# Patient Record
Sex: Male | Born: 1950 | Race: White | Hispanic: No | Marital: Married | Smoking: Former smoker
Health system: Southern US, Community
[De-identification: ages and names within clinical notes are randomized; demographics above are authoritative.]

## PROBLEM LIST (undated history)

## (undated) DIAGNOSIS — I10 Essential (primary) hypertension: Secondary | ICD-10-CM

## (undated) DIAGNOSIS — M1990 Unspecified osteoarthritis, unspecified site: Secondary | ICD-10-CM

## (undated) HISTORY — PX: TONSILLECTOMY: SUR1361

## (undated) HISTORY — PX: ARM AMPUTATION AT ELBOW: SHX550

---

## 2001-10-04 ENCOUNTER — Emergency Department (HOSPITAL_COMMUNITY): Admission: EM | Admit: 2001-10-04 | Discharge: 2001-10-05 | Payer: Self-pay

## 2001-10-05 ENCOUNTER — Encounter: Payer: Self-pay | Admitting: Emergency Medicine

## 2004-06-16 ENCOUNTER — Ambulatory Visit: Payer: Self-pay | Admitting: Internal Medicine

## 2007-11-15 ENCOUNTER — Ambulatory Visit: Payer: Self-pay | Admitting: Internal Medicine

## 2007-11-15 DIAGNOSIS — M79609 Pain in unspecified limb: Secondary | ICD-10-CM

## 2007-11-15 DIAGNOSIS — L408 Other psoriasis: Secondary | ICD-10-CM

## 2007-11-15 DIAGNOSIS — G4730 Sleep apnea, unspecified: Secondary | ICD-10-CM | POA: Insufficient documentation

## 2007-11-15 DIAGNOSIS — I10 Essential (primary) hypertension: Secondary | ICD-10-CM | POA: Insufficient documentation

## 2007-11-15 DIAGNOSIS — E785 Hyperlipidemia, unspecified: Secondary | ICD-10-CM | POA: Insufficient documentation

## 2007-11-15 LAB — CONVERTED CEMR LAB
Cholesterol, target level: 200 mg/dL
HDL goal, serum: 40 mg/dL
LDL Goal: 90 mg/dL

## 2007-11-16 ENCOUNTER — Encounter (INDEPENDENT_AMBULATORY_CARE_PROVIDER_SITE_OTHER): Payer: Self-pay | Admitting: *Deleted

## 2007-12-08 ENCOUNTER — Encounter: Payer: Self-pay | Admitting: Internal Medicine

## 2009-11-20 ENCOUNTER — Ambulatory Visit: Payer: Self-pay | Admitting: Family Medicine

## 2009-11-20 DIAGNOSIS — M25579 Pain in unspecified ankle and joints of unspecified foot: Secondary | ICD-10-CM

## 2010-08-04 NOTE — Assessment & Plan Note (Signed)
Summary: RIGH ANKLE PAIN   Vital Signs:  Patient Profile:   60 Years Old Male CC:      right ankle pain X yesterday Height:     76 inches Weight:      256 pounds O2 Sat:      98 % O2 treatment:    Room Air Temp:     97.5 degrees F oral Pulse rate:   79 / minute Pulse rhythm:   regular Resp:     14 per minute BP sitting:   167 / 95  (left arm) Cuff size:   large  Pt. in pain?   yes    Location:   right ankle    Intensity:   5    Type:       sharp  Vitals Entered By: Lajean Saver RN (Nov 20, 2009 3:14 PM)                   Updated Prior Medication List: ASPIRIN 81 MG TBEC (ASPIRIN) once daily PREDNISONE 10 MG  TABS (PREDNISONE) 5 TABS DAY 1, 4 TABS DAY 2, 3 TABS DAY 3, 2 TABS DAY 4, 1 TAB DAY 5 WITH FOOD  Current Allergies: No known allergies History of Present Illness Chief Complaint: right ankle pain X yesterday History of Present Illness: RIGHT ANKLE PAIN AND SWELLING. ONSET YESTERDAY. DENIES INJURY OR PRIOR EPISODE. NO HX OF GOUT. STATES HARD TO WALK ON.   REVIEW OF SYSTEMS Constitutional Symptoms      Denies fever, chills, night sweats, weight loss, weight gain, and fatigue.  Eyes       Denies change in vision, eye pain, eye discharge, glasses, contact lenses, and eye surgery. Ear/Nose/Throat/Mouth       Denies hearing loss/aids, change in hearing, ear pain, ear discharge, dizziness, frequent runny nose, frequent nose bleeds, sinus problems, sore throat, hoarseness, and tooth pain or bleeding.  Respiratory       Denies dry cough, productive cough, wheezing, shortness of breath, asthma, bronchitis, and emphysema/COPD.  Cardiovascular       Denies murmurs, chest pain, and tires easily with exhertion.    Gastrointestinal       Denies stomach pain, nausea/vomiting, diarrhea, constipation, blood in bowel movements, and indigestion. Genitourniary       Denies painful urination, kidney stones, and loss of urinary control. Neurological       Denies paralysis,  seizures, and fainting/blackouts. Musculoskeletal       Complains of joint pain, joint stiffness, and swelling.      Denies muscle pain, decreased range of motion, redness, muscle weakness, and gout.      Comments: right ankle Skin       Denies bruising, unusual mles/lumps or sores, and hair/skin or nail changes.  Psych       Denies mood changes, temper/anger issues, anxiety/stress, speech problems, depression, and sleep problems. Other Comments: patient states he woke up feeling stiff, as the day went on and with movement the pain increased gradually to a 10/10   Past History:  Past Medical History: Hypertension Current Problems:  GILBERT'S SYNDROME (ICD-277.4) PSORIASIS (ICD-696.1) SLEEP APNEA (ICD-780.57) HYPERTENSION (ICD-401.9)     Past Surgical History: Tonsillectomy Sx to right arm  Family History: Reviewed history from 11/15/2007 and no changes required. Family History Diabetes 1st degree relative-FATHER Family History Hypertension: FATHER, BROTHER, FATHER'S SIDE OF THE FAMILY MI-FATHER, BROTHER, P-UNCLE CVA-FATHER  Social History: Occupation: Holiday representative Married Quit smoking 30 years ago Alcohol use-yes,  24/week Drug use-no  Drug Use:  no Physical Exam General appearance: well developed, well nourished, no acute distress Extremities: PAIN AND SWELLING RIGHT ANKLE. NO INCREASED WARMPTH. NO CALF TENDERNESS.Marland Kitchen PAIN WITH FLEXION AND EXTENTION. LESS PAIN WITH INVERSION AND EVERSION.  Skin: no obvious rashes or lesions BP SLIGHTLY ELEVATED. RECOMMEND HE MONITOR AND SEEK EVAL IF IT REMAINS ELIVATED Assessment New Problems: ANKLE PAIN, RIGHT (ICD-719.47)   Plan New Medications/Changes: PREDNISONE 10 MG  TABS (PREDNISONE) 5 TABS DAY 1, 4 TABS DAY 2, 3 TABS DAY 3, 2 TABS DAY 4, 1 TAB DAY 5 WITH FOOD  ##15 x 0, 11/20/2009, Marvis Moeller DO  New Orders: Ace Wraps 3-5 in/yard  [Z6109] New Patient Level III [99203]   Prescriptions: PREDNISONE 10 MG  TABS  (PREDNISONE) 5 TABS DAY 1, 4 TABS DAY 2, 3 TABS DAY 3, 2 TABS DAY 4, 1 TAB DAY 5 WITH FOOD  ##15 x 0   Entered and Authorized by:   Marvis Moeller DO   Signed by:   Marvis Moeller DO on 11/20/2009   Method used:   Print then Give to Patient   RxID:   [id number]   Patient Instructions: 1)  WEAR ACE FOR 5-6 DAYS. ELEVATE ABOVE WAIST. APPLY HEAT THREE TIMES DAILY FOR 15 MIN. MONITOR YOUR BP. FOLLOW UP WITH YOUR PCP OR RETURN IF SYMPTOMSPERSIST.

## 2016-12-23 ENCOUNTER — Emergency Department (HOSPITAL_COMMUNITY): Payer: BLUE CROSS/BLUE SHIELD

## 2016-12-23 ENCOUNTER — Encounter (HOSPITAL_COMMUNITY): Payer: Self-pay | Admitting: *Deleted

## 2016-12-23 ENCOUNTER — Emergency Department (HOSPITAL_COMMUNITY)
Admission: EM | Admit: 2016-12-23 | Discharge: 2016-12-23 | Disposition: A | Discharge status: Home / Self Care | Payer: BLUE CROSS/BLUE SHIELD | Attending: Emergency Medicine | Admitting: Emergency Medicine

## 2016-12-23 DIAGNOSIS — Y939 Activity, unspecified: Secondary | ICD-10-CM | POA: Insufficient documentation

## 2016-12-23 DIAGNOSIS — R55 Syncope and collapse: Secondary | ICD-10-CM | POA: Insufficient documentation

## 2016-12-23 DIAGNOSIS — I10 Essential (primary) hypertension: Secondary | ICD-10-CM | POA: Diagnosis not present

## 2016-12-23 DIAGNOSIS — S0181XA Laceration without foreign body of other part of head, initial encounter: Secondary | ICD-10-CM | POA: Insufficient documentation

## 2016-12-23 DIAGNOSIS — Z87891 Personal history of nicotine dependence: Secondary | ICD-10-CM | POA: Insufficient documentation

## 2016-12-23 DIAGNOSIS — Y929 Unspecified place or not applicable: Secondary | ICD-10-CM | POA: Diagnosis not present

## 2016-12-23 DIAGNOSIS — Y999 Unspecified external cause status: Secondary | ICD-10-CM | POA: Diagnosis not present

## 2016-12-23 DIAGNOSIS — W19XXXA Unspecified fall, initial encounter: Secondary | ICD-10-CM | POA: Insufficient documentation

## 2016-12-23 HISTORY — DX: Essential (primary) hypertension: I10

## 2016-12-23 HISTORY — DX: Unspecified osteoarthritis, unspecified site: M19.90

## 2016-12-23 LAB — COMPREHENSIVE METABOLIC PANEL
ALBUMIN: 4.3 g/dL (ref 3.5–5.0)
ALK PHOS: 49 U/L (ref 38–126)
ALT: 48 U/L (ref 17–63)
AST: 46 U/L — ABNORMAL HIGH (ref 15–41)
Anion gap: 11 (ref 5–15)
BUN: 15 mg/dL (ref 6–20)
CALCIUM: 9.3 mg/dL (ref 8.9–10.3)
CO2: 25 mmol/L (ref 22–32)
CREATININE: 1.19 mg/dL (ref 0.61–1.24)
Chloride: 102 mmol/L (ref 101–111)
GFR calc non Af Amer: >60 mL/min (ref >60)
GLUCOSE: 111 mg/dL — AB (ref 65–99)
Potassium: 4.6 mmol/L (ref 3.5–5.1)
SODIUM: 138 mmol/L (ref 135–145)
Total Bilirubin: 0.9 mg/dL (ref 0.3–1.2)
Total Protein: 6.9 g/dL (ref 6.5–8.1)

## 2016-12-23 LAB — CBC WITH DIFFERENTIAL/PLATELET
Basophils Absolute: 0 10*3/uL (ref 0.0–0.1)
Basophils Relative: 0 %
EOS ABS: 0 10*3/uL (ref 0.0–0.7)
EOS PCT: 1 %
HCT: 42.5 % (ref 39.0–52.0)
Hemoglobin: 14.2 g/dL (ref 13.0–17.0)
LYMPHS ABS: 1.7 10*3/uL (ref 0.7–4.0)
Lymphocytes Relative: 23 %
MCH: 34.2 pg — AB (ref 26.0–34.0)
MCHC: 33.4 g/dL (ref 30.0–36.0)
MCV: 102.4 fL — ABNORMAL HIGH (ref 78.0–100.0)
MONO ABS: 0.5 10*3/uL (ref 0.1–1.0)
MONOS PCT: 7 %
Neutro Abs: 4.9 10*3/uL (ref 1.7–7.7)
Neutrophils Relative %: 69 %
PLATELETS: 201 10*3/uL (ref 150–400)
RBC: 4.15 MIL/uL — ABNORMAL LOW (ref 4.22–5.81)
RDW: 12.3 % (ref 11.5–15.5)
WBC: 7 10*3/uL (ref 4.0–10.5)

## 2016-12-23 LAB — LIPASE, BLOOD: Lipase: 26 U/L (ref 11–51)

## 2016-12-23 LAB — PROTIME-INR
INR: 1.03
PROTHROMBIN TIME: 13.5 s (ref 11.4–15.2)

## 2016-12-23 LAB — I-STAT TROPONIN, ED: Troponin i, poc: 0 ng/mL (ref 0.00–0.08)

## 2016-12-23 MED ORDER — LIDOCAINE-EPINEPHRINE (PF) 2 %-1:200000 IJ SOLN
10.0000 mL | Freq: Once | INTRAMUSCULAR | Status: AC
  Administered 2016-12-23: 10 mL
  Filled 2016-12-23: qty 20

## 2016-12-23 NOTE — ED Provider Notes (Signed)
[facility]-EMERGENCY DEPT Provider Note   CSN: [id number] Arrival date & time: 12/23/16  0940     History   Chief Complaint Chief Complaint  Patient presents with  . Fall    HPI Brett Bush is a 66 y.o. male.  HPI Patient is referred from his family physician's office to the emergency department. He reports he stood up from his recliner yesterday evening at about 10:45 PM took one step and then lost consciousness. He denies preceding symptoms. He states he thinks he just stood up too quickly. He reports he awakened immediately and was oriented to surroundings. Patient does regularly have several alcoholic beverages in the evening. He denies he's been experiencing any headache or chest pain. He reports he did get a laceration above his right brow which he had placed a Band-Aid over. He denies he's having any headache, nausea or visual change. A1c does have some right sided chest pain that occurs only if he moves his arms certain way or takes a deep breath. Patient reports he does not believe this occurred due to a heart related problems. He reports he had a stress test about a year and a half ago with no significant abnormality. Past Medical History:  Diagnosis Date  . Arthritis   . Hypertension     Patient Active Problem List   Diagnosis Date Noted  . ANKLE PAIN, RIGHT 11/20/2009  . HYPERLIPIDEMIA 11/15/2007  . GILBERT'S SYNDROME 11/15/2007  . Unspecified essential hypertension 11/15/2007  . PSORIASIS 11/15/2007  . FOOT PAIN, RIGHT 11/15/2007  . SLEEP APNEA 11/15/2007    Past Surgical History:  Procedure Laterality Date  . 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 TONSILLECTOMY         Home Medications    Prior to Admission medications   Medication Sig Start Date End Date Taking? Authorizing Provider  celecoxib (CELEBREX) 200 MG capsule Take 200 mg by mouth daily. 10/26/16 10/26/17 Yes [provider]  metoprolol succinate (TOPROL-XL) 50 MG 24 hr tablet Take 50 mg by  mouth daily. 11/27/16  Yes [provider]  valsartan-hydrochlorothiazide (DIOVAN-HCT) 320-25 MG tablet Take 1 tablet by mouth daily. 12/07/16  Yes [provider]    Family History No family history on file.  Social History Social History  Substance Use Topics  . Smoking status: Former Games developer  . Smokeless tobacco: Never Used  . Alcohol use Yes     Allergies   Statins   Review of Systems Review of Systems 10 Systems reviewed and are negative for acute change except as noted in the HPI.  Physical Exam Updated Vital Signs BP (!) 158/87 (BP Location: Right Arm)   Pulse 84   Temp 98.3 F (36.8 C) (Oral)   Resp 18   Ht 6\' 4"  (1.93 m)   Wt 120.2 kg (265 lb)   SpO2 96%   BMI 32.26 kg/m   Physical Exam  Constitutional: He is oriented to person, place, and time. He appears well-developed and well-nourished.  HENT:  2 cm brow laceration over the right eye. Moderate surrounding hematoma. No active bleeding. Extraocular motions normal. No scleral injection or hematoma. Pupillary response is normal.  Eyes: Conjunctivae and EOM are normal. Pupils are equal, round, and reactive to light.  Neck: Neck supple.  Cardiovascular: Normal rate and regular rhythm.   No murmur heard. Pulmonary/Chest: Effort normal and breath sounds normal. No respiratory distress.  Abdominal: Soft. He exhibits no distension. There is no tenderness.  Musculoskeletal: Normal range of  motion. He exhibits no edema or tenderness.  Neurological: He is alert and oriented to person, place, and time. No cranial nerve deficit. He exhibits normal muscle tone. Coordination normal.  Skin: Skin is warm and dry.  Psychiatric: He has a normal mood and affect.  Nursing note and vitals reviewed.    ED Treatments / Results  Labs (all labs ordered are listed, but only abnormal results are displayed) Labs Reviewed  COMPREHENSIVE METABOLIC PANEL - Abnormal; Notable for the following:       Result Value    Glucose, Bld 111 (*)    AST 46 (*)    All other components within normal limits  CBC WITH DIFFERENTIAL/PLATELET - Abnormal; Notable for the following:    RBC 4.15 (*)    MCV 102.4 (*)    MCH 34.2 (*)    All other components within normal limits  LIPASE, BLOOD  PROTIME-INR  I-STAT TROPOININ, ED    EKG  EKG Interpretation  Date/Time:  Thursday December 23 2016 09:47:29 EDT Ventricular Rate:  82 PR Interval:    QRS Duration: 90 QT Interval:  371 QTC Calculation: 434 R Axis:   23 Text Interpretation:  Sinus rhythm Borderline T abnormalities, anterior leads agree, no ld comparison Confirmed by Arby BarrettePfeiffer, Tylor Courtwright [phone]([id number]) on 12/23/2016 10:47:23 AM       Radiology Dg Chest 2 View  Result Date: 12/23/2016 CLINICAL DATA:  Chest pain, syncope EXAM: CHEST  2 VIEW COMPARISON:  None. FINDINGS: There is no focal parenchymal opacity. There is no pleural effusion or pneumothorax. There is stable cardiomegaly. The osseous structures are unremarkable. IMPRESSION: No active cardiopulmonary disease. Electronically Signed   By: Elige KoHetal  Patel   On: 12/23/2016 11:12    Procedures Procedures (including critical care time)  Medications Ordered in ED Medications  lidocaine-EPINEPHrine (XYLOCAINE W/EPI) 2 %-1:200000 (PF) injection 10 mL (10 mLs Infiltration Given 12/23/16 1141)     Initial Impression / Assessment and Plan / ED Course  I have reviewed the triage vital signs and the nursing notes.  Pertinent labs & imaging results that were available during my care of the patient were reviewed by me and considered in my medical decision making (see chart for details).     Final Clinical Impressions(s) / ED Diagnoses   Final diagnoses:  Syncope and collapse  Laceration of brow without complication, initial encounter   Patient referred from his PCP office for a syncopal episode of brow laceration. Patient's cardiac enzymes are normal. No evidence of an STEMI. Patient did not have any preceding chest  pain or chest pain after the event. He has borderline T wave inversion but no acute ischemic appearance on the EKG. I doubt cardiac ischemia as the etiology for his syncope. I suspect patient had static hypotension from alcohol consumption prolonged sitting. Patient requested minimal diagnostic evaluation and did not feel that he needs a CT head. He reports he has had no headache and he didn't have any confusion or other neurologic symptoms. He is not on any anticoagulant. His laceration was repaired. Return precautions have been reviewed. Patient's counselled follow-up plan of seeing his PCP and cardiologist for recheck. New Prescriptions New Prescriptions   No medications on file     Arby BarrettePfeiffer, Mahiya Kercheval, MD 12/23/16 1239

## 2016-12-23 NOTE — ED Notes (Signed)
Patient states he had 2 alcoholic drinks last pm which is not abnormal for him. States he remembers getting up to go to bed and woke up on the hardwood floor with laceration right eyebrow. Approx. 1 inch lac. To right eyebrow. States he went to his doctor this am to have lac sutured while at the office was also c/o right anterior chest pain worse with inspiration and movement. States his EKG was abnormal.Patient is alert oriented.

## 2016-12-23 NOTE — ED Provider Notes (Signed)
LACERATION REPAIR Performed by: Barrett HenleNicole Elizabeth Emelly Wurtz Authorized by: Barrett HenleNicole Elizabeth Wiatt Mahabir Consent: Verbal consent obtained. Risks and benefits: risks, benefits and alternatives were discussed Consent given by: patient Patient identity confirmed: provided demographic data Prepped and Draped in normal sterile fashion Wound explored  Laceration Location: right eyebrow  Laceration Length: 2cm  No Foreign Bodies seen or palpated  Anesthesia: local infiltration  Local anesthetic: lidocaine 2% with epinephrine  Anesthetic total: 3 ml  Irrigation method: syringe Amount of cleaning: standard  Skin closure: 5-0 Prolene  Number of sutures: 5  Technique: simple interrupted  Patient tolerance: Patient tolerated the procedure well with no immediate complications.    Barrett Henleadeau, Lourdes Kucharski Elizabeth, PA-C 12/23/16 1200    Arby BarrettePfeiffer, Marcy, MD 01/10/17 1455

## 2018-05-27 IMAGING — DX DG CHEST 2V
2 series · 2 of 2 positions shown · non-contrast
Comparison: None.

CLINICAL DATA: Chest pain, syncope

EXAM:
CHEST  2 VIEW

[chest pa]
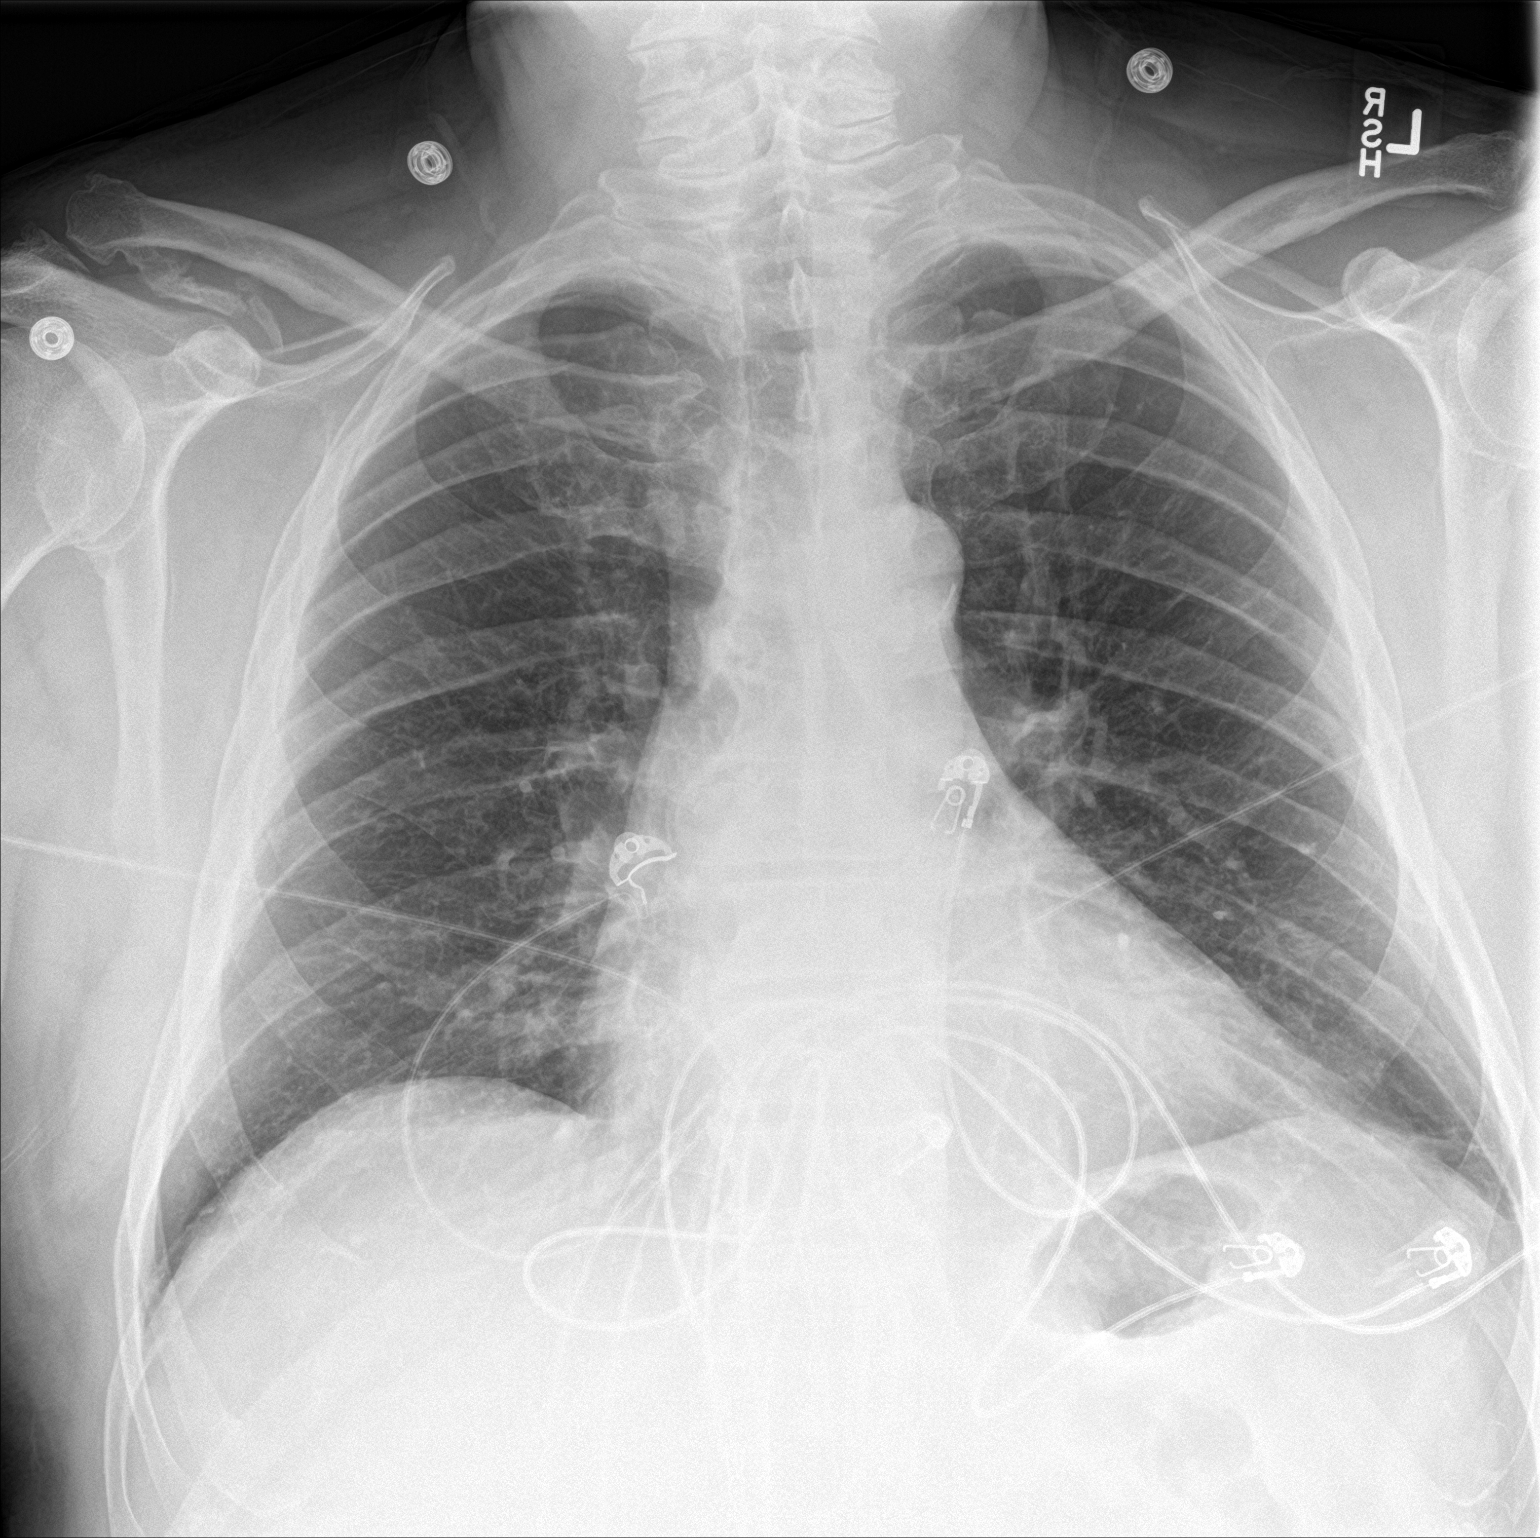

[chest lat]
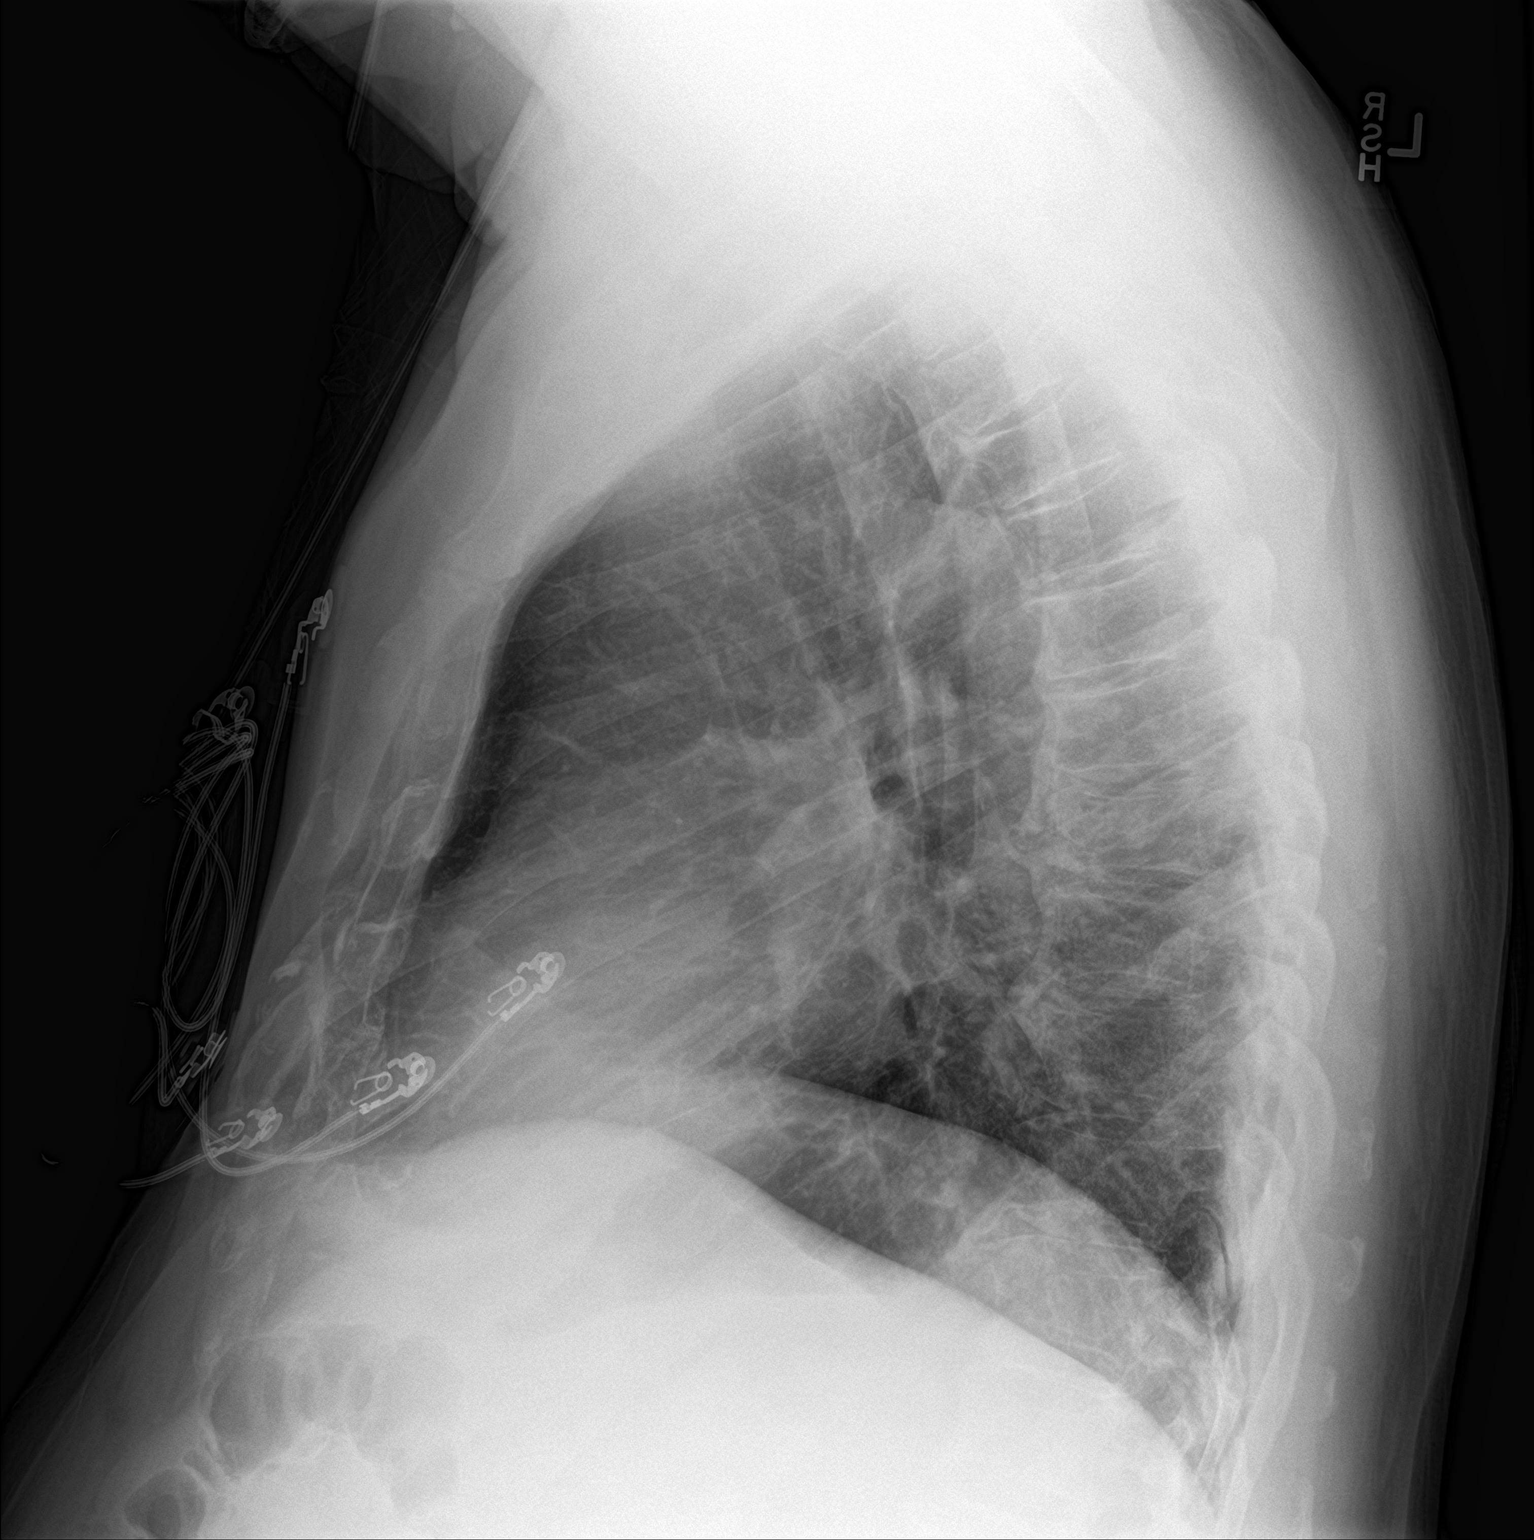

[2 of 2 positions shown; findings below may reference images not displayed]

FINDINGS: There is no focal parenchymal opacity. There is no pleural effusion
or pneumothorax. There is stable cardiomegaly.

The osseous structures are unremarkable.
IMPRESSION: No active cardiopulmonary disease.
